# Patient Record
Sex: Female | Born: 1945 | Race: White | Hispanic: No | Marital: Married
Health system: Southern US, Community
[De-identification: ages and names within clinical notes are randomized; demographics above are authoritative.]

---

## 1999-01-02 ENCOUNTER — Other Ambulatory Visit: Admission: RE | Admit: 1999-01-02 | Discharge: 1999-01-02 | Payer: Self-pay | Admitting: Gynecology

## 2000-03-17 ENCOUNTER — Other Ambulatory Visit: Admission: RE | Admit: 2000-03-17 | Discharge: 2000-03-17 | Payer: Self-pay | Admitting: Gynecology

## 2000-09-22 ENCOUNTER — Other Ambulatory Visit: Admission: RE | Admit: 2000-09-22 | Discharge: 2000-09-22 | Payer: Self-pay | Admitting: Gynecology

## 2001-04-13 ENCOUNTER — Other Ambulatory Visit: Admission: RE | Admit: 2001-04-13 | Discharge: 2001-04-13 | Payer: Self-pay | Admitting: Gynecology

## 2002-05-25 ENCOUNTER — Other Ambulatory Visit: Admission: RE | Admit: 2002-05-25 | Discharge: 2002-05-25 | Payer: Self-pay | Admitting: Gynecology

## 2003-09-20 ENCOUNTER — Other Ambulatory Visit: Admission: RE | Admit: 2003-09-20 | Discharge: 2003-09-20 | Payer: Self-pay | Admitting: Gynecology

## 2004-11-07 ENCOUNTER — Other Ambulatory Visit: Admission: RE | Admit: 2004-11-07 | Discharge: 2004-11-07 | Payer: Self-pay | Admitting: Family Medicine

## 2005-01-14 ENCOUNTER — Encounter: Admission: RE | Admit: 2005-01-14 | Discharge: 2005-01-14 | Payer: Self-pay | Admitting: Family Medicine

## 2005-09-26 ENCOUNTER — Ambulatory Visit (HOSPITAL_COMMUNITY): Admission: RE | Admit: 2005-09-26 | Discharge: 2005-09-26 | Payer: Self-pay | Admitting: Orthopedic Surgery

## 2006-01-16 ENCOUNTER — Encounter: Admission: RE | Admit: 2006-01-16 | Discharge: 2006-01-16 | Payer: Self-pay | Admitting: Family Medicine

## 2006-01-28 ENCOUNTER — Other Ambulatory Visit: Admission: RE | Admit: 2006-01-28 | Discharge: 2006-01-28 | Payer: Self-pay | Admitting: Family Medicine

## 2007-01-19 ENCOUNTER — Encounter: Admission: RE | Admit: 2007-01-19 | Discharge: 2007-01-19 | Payer: Self-pay | Admitting: Family Medicine

## 2008-02-01 ENCOUNTER — Encounter: Admission: RE | Admit: 2008-02-01 | Discharge: 2008-02-01 | Payer: Self-pay | Admitting: Family Medicine

## 2008-04-22 ENCOUNTER — Other Ambulatory Visit: Admission: RE | Admit: 2008-04-22 | Discharge: 2008-04-22 | Payer: Self-pay | Admitting: Family Medicine

## 2008-05-10 ENCOUNTER — Encounter: Admission: RE | Admit: 2008-05-10 | Discharge: 2008-05-10 | Payer: Self-pay | Admitting: Orthopedic Surgery

## 2009-02-01 ENCOUNTER — Encounter: Admission: RE | Admit: 2009-02-01 | Discharge: 2009-02-01 | Payer: Self-pay | Admitting: Family Medicine

## 2009-05-02 ENCOUNTER — Other Ambulatory Visit: Admission: RE | Admit: 2009-05-02 | Discharge: 2009-05-02 | Payer: Self-pay | Admitting: Family Medicine

## 2009-06-02 ENCOUNTER — Emergency Department (HOSPITAL_COMMUNITY): Admission: EM | Admit: 2009-06-02 | Discharge: 2009-06-02 | Payer: Self-pay | Admitting: Emergency Medicine

## 2010-02-21 ENCOUNTER — Encounter: Admission: RE | Admit: 2010-02-21 | Discharge: 2010-02-21 | Payer: Self-pay | Admitting: Family Medicine

## 2010-10-10 LAB — URINALYSIS, ROUTINE W REFLEX MICROSCOPIC
Hgb urine dipstick: NEGATIVE
Urobilinogen, UA: 0.2 mg/dL (ref 0.0–1.0)
pH: 6.5 (ref 5.0–8.0)

## 2010-10-10 LAB — POCT I-STAT, CHEM 8
BUN: 12 mg/dL (ref 6–23)
Calcium, Ion: 1.14 mmol/L (ref 1.12–1.32)
Creatinine, Ser: 0.8 mg/dL (ref 0.4–1.2)
Glucose, Bld: 103 mg/dL — ABNORMAL HIGH (ref 70–99)
Hemoglobin: 15.6 g/dL — ABNORMAL HIGH (ref 12.0–15.0)
TCO2: 26 mmol/L (ref 0–100)

## 2010-10-10 LAB — URINE MICROSCOPIC-ADD ON

## 2010-10-10 LAB — POCT CARDIAC MARKERS
CKMB, poc: <1 ng/mL — ABNORMAL LOW (ref 1.0–8.0)
Myoglobin, poc: 28.2 ng/mL (ref 12–200)
Myoglobin, poc: 35.5 ng/mL (ref 12–200)

## 2011-06-05 ENCOUNTER — Other Ambulatory Visit: Payer: Self-pay | Admitting: Family Medicine

## 2011-06-05 DIAGNOSIS — Z1231 Encounter for screening mammogram for malignant neoplasm of breast: Secondary | ICD-10-CM

## 2011-07-04 ENCOUNTER — Ambulatory Visit
Admission: RE | Admit: 2011-07-04 | Discharge: 2011-07-04 | Disposition: A | Discharge status: Home / Self Care | Payer: Medicare Other | Source: Ambulatory Visit | Attending: Family Medicine | Admitting: Family Medicine

## 2011-07-04 DIAGNOSIS — Z1231 Encounter for screening mammogram for malignant neoplasm of breast: Secondary | ICD-10-CM

## 2012-06-24 ENCOUNTER — Other Ambulatory Visit: Payer: Self-pay | Admitting: Family Medicine

## 2012-06-24 DIAGNOSIS — Z1231 Encounter for screening mammogram for malignant neoplasm of breast: Secondary | ICD-10-CM

## 2012-07-15 ENCOUNTER — Ambulatory Visit
Admission: RE | Admit: 2012-07-15 | Discharge: 2012-07-15 | Disposition: A | Discharge status: Home / Self Care | Payer: Medicare Other | Source: Ambulatory Visit | Attending: Family Medicine | Admitting: Family Medicine

## 2012-07-15 DIAGNOSIS — Z1231 Encounter for screening mammogram for malignant neoplasm of breast: Secondary | ICD-10-CM

## 2013-07-20 ENCOUNTER — Other Ambulatory Visit: Payer: Self-pay

## 2013-07-20 DIAGNOSIS — Z1231 Encounter for screening mammogram for malignant neoplasm of breast: Secondary | ICD-10-CM

## 2013-08-09 ENCOUNTER — Ambulatory Visit
Admission: RE | Admit: 2013-08-09 | Discharge: 2013-08-09 | Disposition: A | Discharge status: Home / Self Care | Payer: Medicare Other | Source: Ambulatory Visit

## 2013-08-09 DIAGNOSIS — Z1231 Encounter for screening mammogram for malignant neoplasm of breast: Secondary | ICD-10-CM

## 2014-07-22 ENCOUNTER — Other Ambulatory Visit: Payer: Self-pay

## 2014-07-22 DIAGNOSIS — Z1231 Encounter for screening mammogram for malignant neoplasm of breast: Secondary | ICD-10-CM

## 2014-08-10 ENCOUNTER — Ambulatory Visit
Admission: RE | Admit: 2014-08-10 | Discharge: 2014-08-10 | Disposition: A | Discharge status: Home / Self Care | Payer: Medicare Other | Source: Ambulatory Visit

## 2014-08-10 DIAGNOSIS — Z1231 Encounter for screening mammogram for malignant neoplasm of breast: Secondary | ICD-10-CM

## 2015-08-17 ENCOUNTER — Other Ambulatory Visit: Payer: Self-pay

## 2015-08-17 DIAGNOSIS — Z1231 Encounter for screening mammogram for malignant neoplasm of breast: Secondary | ICD-10-CM

## 2015-09-13 ENCOUNTER — Ambulatory Visit
Admission: RE | Admit: 2015-09-13 | Discharge: 2015-09-13 | Disposition: A | Discharge status: Home / Self Care | Payer: Medicare Other | Source: Ambulatory Visit

## 2015-09-13 DIAGNOSIS — Z1231 Encounter for screening mammogram for malignant neoplasm of breast: Secondary | ICD-10-CM

## 2016-09-13 ENCOUNTER — Other Ambulatory Visit: Payer: Self-pay | Admitting: Family Medicine

## 2016-09-13 DIAGNOSIS — Z1231 Encounter for screening mammogram for malignant neoplasm of breast: Secondary | ICD-10-CM

## 2016-09-16 ENCOUNTER — Ambulatory Visit
Admission: RE | Admit: 2016-09-16 | Discharge: 2016-09-16 | Disposition: A | Discharge status: Home / Self Care | Payer: Medicare Other | Source: Ambulatory Visit | Attending: Family Medicine | Admitting: Family Medicine

## 2016-09-16 DIAGNOSIS — Z1231 Encounter for screening mammogram for malignant neoplasm of breast: Secondary | ICD-10-CM

## 2017-08-19 ENCOUNTER — Other Ambulatory Visit: Payer: Self-pay | Admitting: Family Medicine

## 2017-08-19 DIAGNOSIS — Z1231 Encounter for screening mammogram for malignant neoplasm of breast: Secondary | ICD-10-CM

## 2017-09-17 ENCOUNTER — Ambulatory Visit
Admission: RE | Admit: 2017-09-17 | Discharge: 2017-09-17 | Disposition: A | Discharge status: Home / Self Care | Payer: Medicare Other | Source: Ambulatory Visit | Attending: Family Medicine | Admitting: Family Medicine

## 2017-09-17 DIAGNOSIS — Z1231 Encounter for screening mammogram for malignant neoplasm of breast: Secondary | ICD-10-CM

## 2019-01-11 ENCOUNTER — Other Ambulatory Visit: Payer: Self-pay | Admitting: Family Medicine

## 2019-01-11 DIAGNOSIS — Z1231 Encounter for screening mammogram for malignant neoplasm of breast: Secondary | ICD-10-CM

## 2019-02-19 ENCOUNTER — Other Ambulatory Visit: Payer: Self-pay

## 2019-02-19 ENCOUNTER — Ambulatory Visit
Admission: RE | Admit: 2019-02-19 | Discharge: 2019-02-19 | Disposition: A | Discharge status: Home / Self Care | Payer: Medicare Other | Source: Ambulatory Visit | Attending: Family Medicine | Admitting: Family Medicine

## 2019-02-19 DIAGNOSIS — Z1231 Encounter for screening mammogram for malignant neoplasm of breast: Secondary | ICD-10-CM

## 2019-08-15 ENCOUNTER — Ambulatory Visit: Payer: Medicare Other

## 2019-09-02 ENCOUNTER — Ambulatory Visit: Payer: Medicare Other

## 2020-01-26 ENCOUNTER — Other Ambulatory Visit: Payer: Self-pay | Admitting: Family Medicine

## 2020-01-26 DIAGNOSIS — Z1231 Encounter for screening mammogram for malignant neoplasm of breast: Secondary | ICD-10-CM

## 2020-02-22 ENCOUNTER — Other Ambulatory Visit: Payer: Self-pay

## 2020-02-22 ENCOUNTER — Ambulatory Visit
Admission: RE | Admit: 2020-02-22 | Discharge: 2020-02-22 | Disposition: A | Discharge status: Home / Self Care | Payer: Medicare Other | Source: Ambulatory Visit | Attending: Family Medicine | Admitting: Family Medicine

## 2020-02-22 DIAGNOSIS — Z1231 Encounter for screening mammogram for malignant neoplasm of breast: Secondary | ICD-10-CM

## 2020-06-24 ENCOUNTER — Ambulatory Visit: Payer: Medicare Other | Attending: Internal Medicine

## 2020-06-24 DIAGNOSIS — Z23 Encounter for immunization: Secondary | ICD-10-CM

## 2020-06-24 NOTE — Progress Notes (Signed)
   Covid-19 Vaccination Clinic  Name:  Jodi Kramer    MRN: [id number] DOB: 11/05/1945  06/24/2020  Jodi Kramer was observed post Covid-19 immunization for 15 minutes without incident. She was provided with Vaccine Information Sheet and instruction to access the V-Safe system.   Jodi Kramer was instructed to call 911 with any severe reactions post vaccine: Marland Kitchen Difficulty breathing  . Swelling of face and throat  . A fast heartbeat  . A bad rash all over body  . Dizziness and weakness   Immunizations Administered    Name Date Dose VIS Date Route   Moderna Covid-19 Booster Vaccine 06/24/2020 10:22 AM 0.25 mL 04/26/2020 Intramuscular   Manufacturer: Gala Murdoch   Lot: [id number]   NDC: 50354-656-81

## 2020-07-11 DIAGNOSIS — H1033 Unspecified acute conjunctivitis, bilateral: Secondary | ICD-10-CM | POA: Diagnosis not present

## 2020-10-02 ENCOUNTER — Ambulatory Visit
Admission: RE | Admit: 2020-10-02 | Discharge: 2020-10-02 | Disposition: A | Discharge status: Home / Self Care | Payer: Medicare Other | Source: Ambulatory Visit | Attending: Family Medicine | Admitting: Family Medicine

## 2020-10-02 ENCOUNTER — Other Ambulatory Visit: Payer: Self-pay | Admitting: Family Medicine

## 2020-10-02 DIAGNOSIS — R059 Cough, unspecified: Secondary | ICD-10-CM | POA: Diagnosis not present

## 2020-10-02 DIAGNOSIS — J069 Acute upper respiratory infection, unspecified: Secondary | ICD-10-CM | POA: Diagnosis not present

## 2020-11-01 ENCOUNTER — Other Ambulatory Visit: Payer: Self-pay | Admitting: Family Medicine

## 2020-11-01 ENCOUNTER — Ambulatory Visit
Admission: RE | Admit: 2020-11-01 | Discharge: 2020-11-01 | Disposition: A | Discharge status: Home / Self Care | Payer: Medicare Other | Source: Ambulatory Visit | Attending: Family Medicine | Admitting: Family Medicine

## 2020-11-01 DIAGNOSIS — J189 Pneumonia, unspecified organism: Secondary | ICD-10-CM

## 2020-11-01 DIAGNOSIS — R7303 Prediabetes: Secondary | ICD-10-CM | POA: Diagnosis not present

## 2020-11-01 DIAGNOSIS — I129 Hypertensive chronic kidney disease with stage 1 through stage 4 chronic kidney disease, or unspecified chronic kidney disease: Secondary | ICD-10-CM | POA: Diagnosis not present

## 2020-11-01 DIAGNOSIS — N183 Chronic kidney disease, stage 3 unspecified: Secondary | ICD-10-CM | POA: Diagnosis not present

## 2020-11-01 DIAGNOSIS — J449 Chronic obstructive pulmonary disease, unspecified: Secondary | ICD-10-CM | POA: Diagnosis not present

## 2020-11-24 DIAGNOSIS — M461 Sacroiliitis, not elsewhere classified: Secondary | ICD-10-CM | POA: Diagnosis not present

## 2020-12-28 DIAGNOSIS — M545 Low back pain, unspecified: Secondary | ICD-10-CM | POA: Diagnosis not present

## 2021-01-03 ENCOUNTER — Ambulatory Visit
Admission: RE | Admit: 2021-01-03 | Discharge: 2021-01-03 | Disposition: A | Discharge status: Home / Self Care | Payer: Medicare Other | Source: Ambulatory Visit | Attending: Sports Medicine | Admitting: Sports Medicine

## 2021-01-03 ENCOUNTER — Other Ambulatory Visit: Payer: Self-pay

## 2021-01-03 ENCOUNTER — Other Ambulatory Visit: Payer: Self-pay | Admitting: Sports Medicine

## 2021-01-03 DIAGNOSIS — M545 Low back pain, unspecified: Secondary | ICD-10-CM

## 2021-01-24 DIAGNOSIS — M545 Low back pain, unspecified: Secondary | ICD-10-CM | POA: Diagnosis not present

## 2021-02-01 DIAGNOSIS — M545 Low back pain, unspecified: Secondary | ICD-10-CM | POA: Diagnosis not present

## 2021-02-02 ENCOUNTER — Other Ambulatory Visit: Payer: Self-pay | Admitting: Family Medicine

## 2021-02-02 DIAGNOSIS — Z03818 Encounter for observation for suspected exposure to other biological agents ruled out: Secondary | ICD-10-CM | POA: Diagnosis not present

## 2021-02-02 DIAGNOSIS — R509 Fever, unspecified: Secondary | ICD-10-CM | POA: Diagnosis not present

## 2021-02-02 DIAGNOSIS — J069 Acute upper respiratory infection, unspecified: Secondary | ICD-10-CM | POA: Diagnosis not present

## 2021-02-02 DIAGNOSIS — Z1231 Encounter for screening mammogram for malignant neoplasm of breast: Secondary | ICD-10-CM

## 2021-02-02 DIAGNOSIS — R059 Cough, unspecified: Secondary | ICD-10-CM | POA: Diagnosis not present

## 2021-02-05 DIAGNOSIS — J019 Acute sinusitis, unspecified: Secondary | ICD-10-CM | POA: Diagnosis not present

## 2021-02-05 DIAGNOSIS — J069 Acute upper respiratory infection, unspecified: Secondary | ICD-10-CM | POA: Diagnosis not present

## 2021-03-06 DIAGNOSIS — M7989 Other specified soft tissue disorders: Secondary | ICD-10-CM | POA: Diagnosis not present

## 2021-03-06 DIAGNOSIS — K521 Toxic gastroenteritis and colitis: Secondary | ICD-10-CM | POA: Diagnosis not present

## 2021-03-06 DIAGNOSIS — T3695XA Adverse effect of unspecified systemic antibiotic, initial encounter: Secondary | ICD-10-CM | POA: Diagnosis not present

## 2021-03-08 DIAGNOSIS — Z96652 Presence of left artificial knee joint: Secondary | ICD-10-CM | POA: Diagnosis not present

## 2021-03-23 ENCOUNTER — Ambulatory Visit
Admission: RE | Admit: 2021-03-23 | Discharge: 2021-03-23 | Disposition: A | Discharge status: Home / Self Care | Payer: Medicare Other | Source: Ambulatory Visit | Attending: Family Medicine | Admitting: Family Medicine

## 2021-03-23 ENCOUNTER — Other Ambulatory Visit: Payer: Self-pay

## 2021-03-23 DIAGNOSIS — Z1231 Encounter for screening mammogram for malignant neoplasm of breast: Secondary | ICD-10-CM | POA: Diagnosis not present

## 2021-05-20 IMAGING — CR DG CHEST 2V
1 series · 1 of 1 positions shown · non-contrast
Comparison: Chest x-ray 10/02/2020

CLINICAL DATA: Right-sided pneumonia.

EXAM:
CHEST - 2 VIEW

[w chest lat]
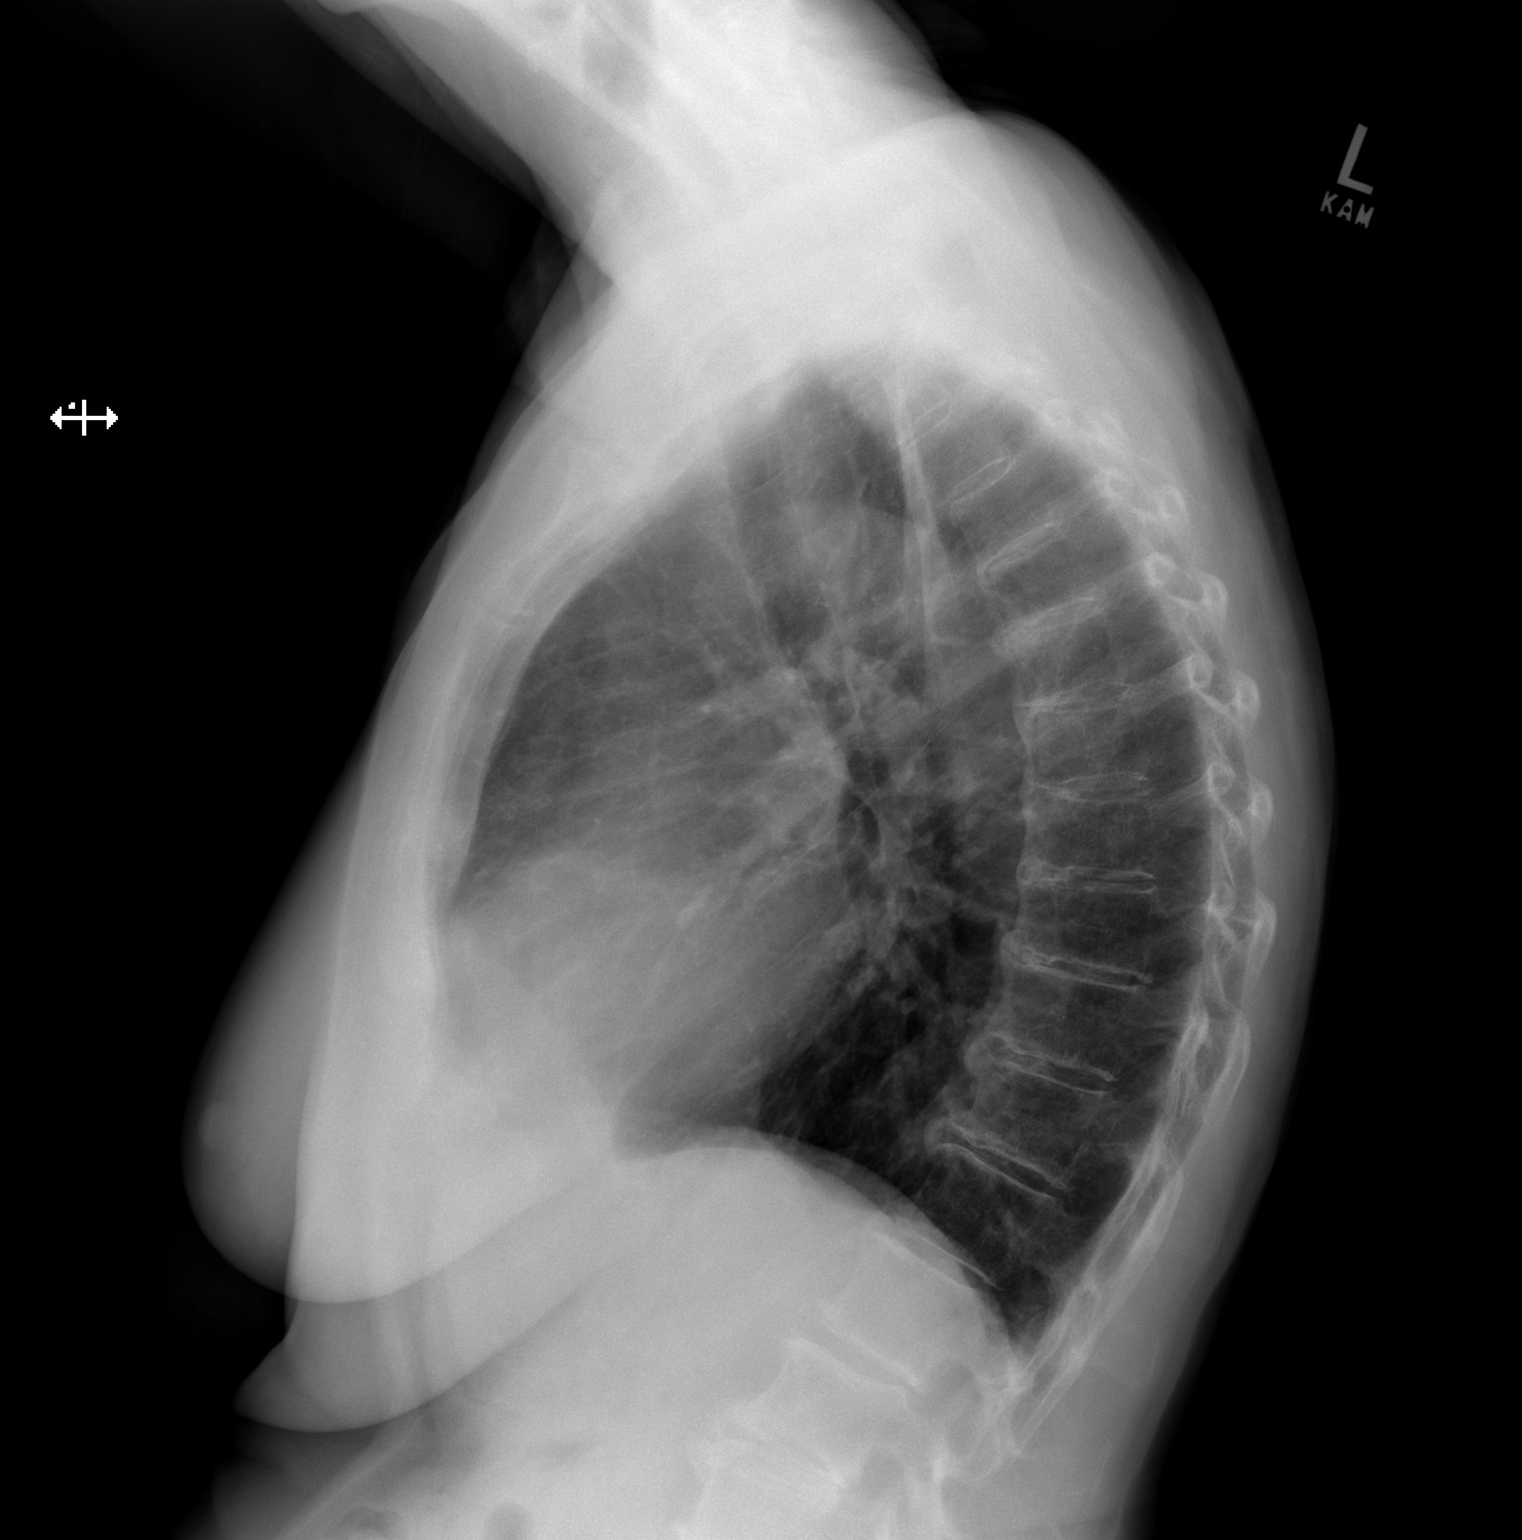

[1 of 1 positions shown; findings below may reference images not displayed]

FINDINGS: Heart size is normal. Previously noted right middle lobe airspace
disease has resolved. Changes of COPD again noted. No edema or
effusion is present. Exaggerated thoracic kyphosis is stable.
IMPRESSION: Interval resolution of right middle lobe airspace disease.

Stable changes of COPD.

## 2021-10-09 IMAGING — MG MM DIGITAL SCREENING BILAT W/ TOMO AND CAD
8 series · 8 of 24 positions shown · non-contrast
Comparison: Previous exam(s).

CLINICAL DATA: Screening.

EXAM:
DIGITAL SCREENING BILATERAL MAMMOGRAM WITH TOMOSYNTHESIS AND CAD
TECHNIQUE: Bilateral screening digital craniocaudal and mediolateral oblique
mammograms were obtained. Bilateral screening digital breast
tomosynthesis was performed. The images were evaluated with
computer-aided detection.

[R CC synth-2D]
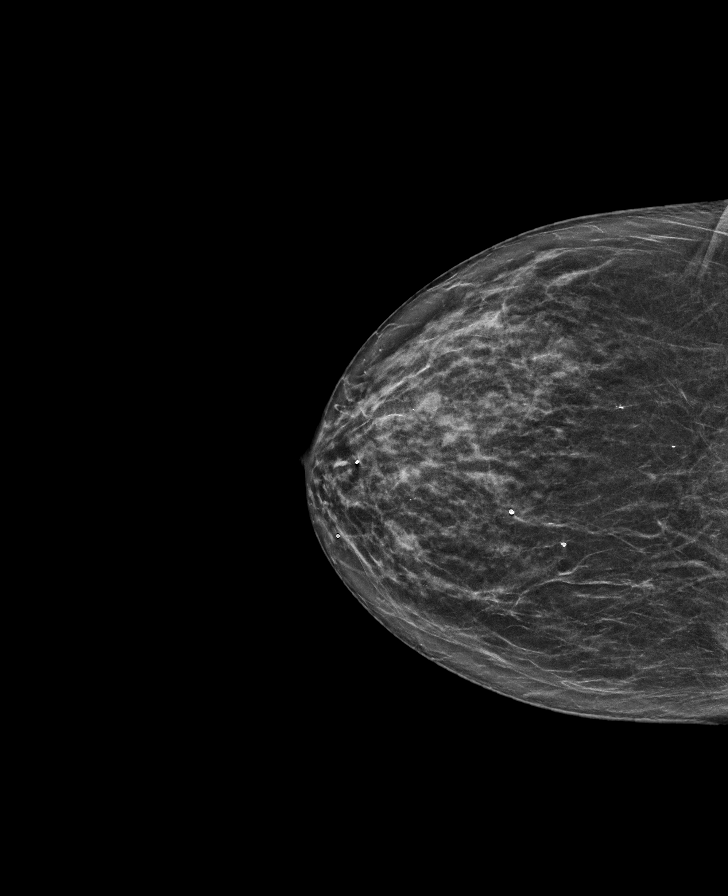

[L MLO synth-2D]
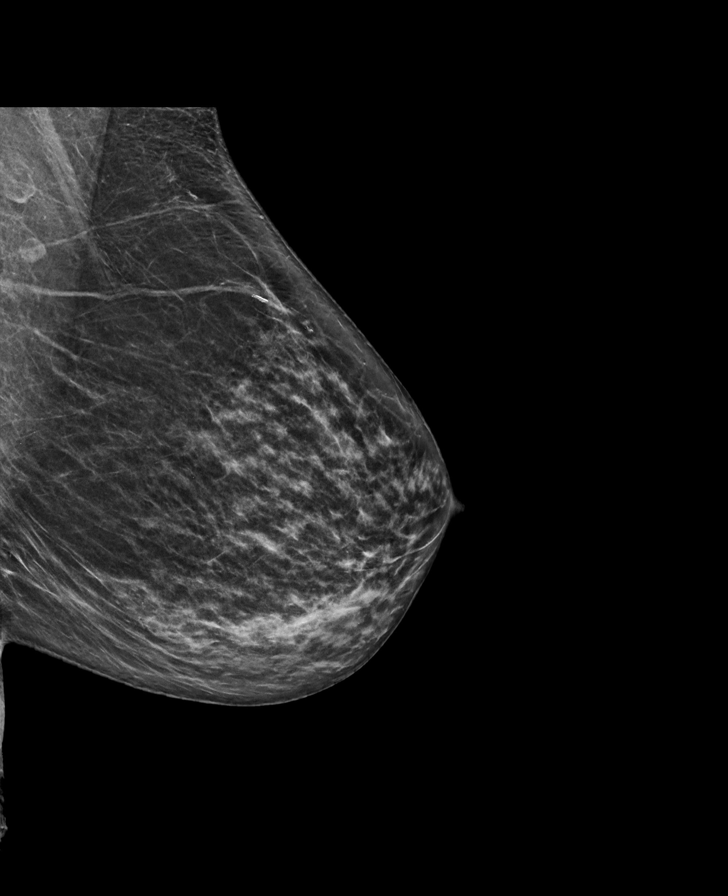

[L CC synth-2D]
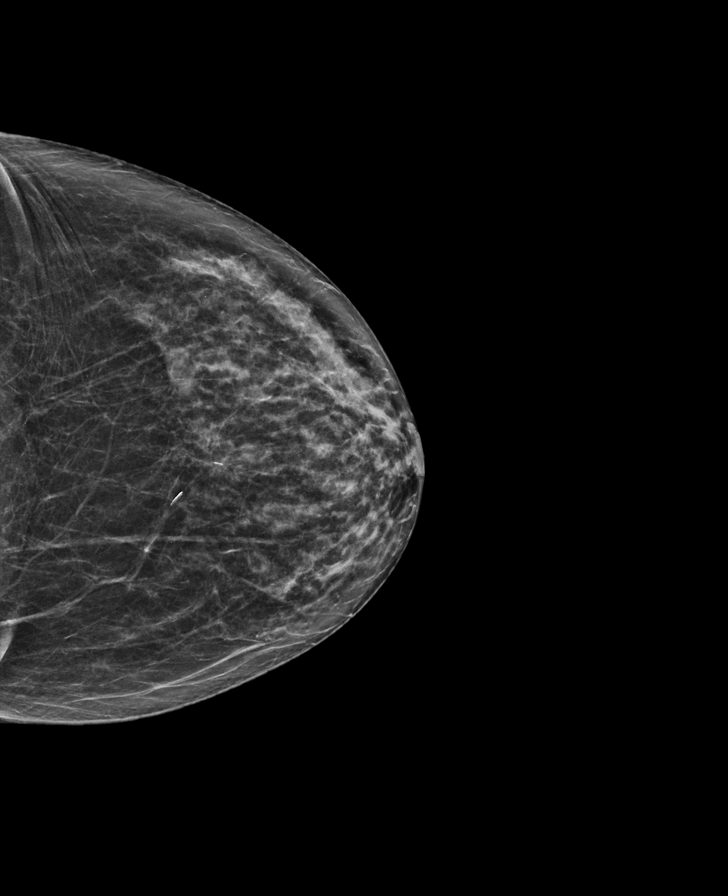

[R MLO synth-2D]
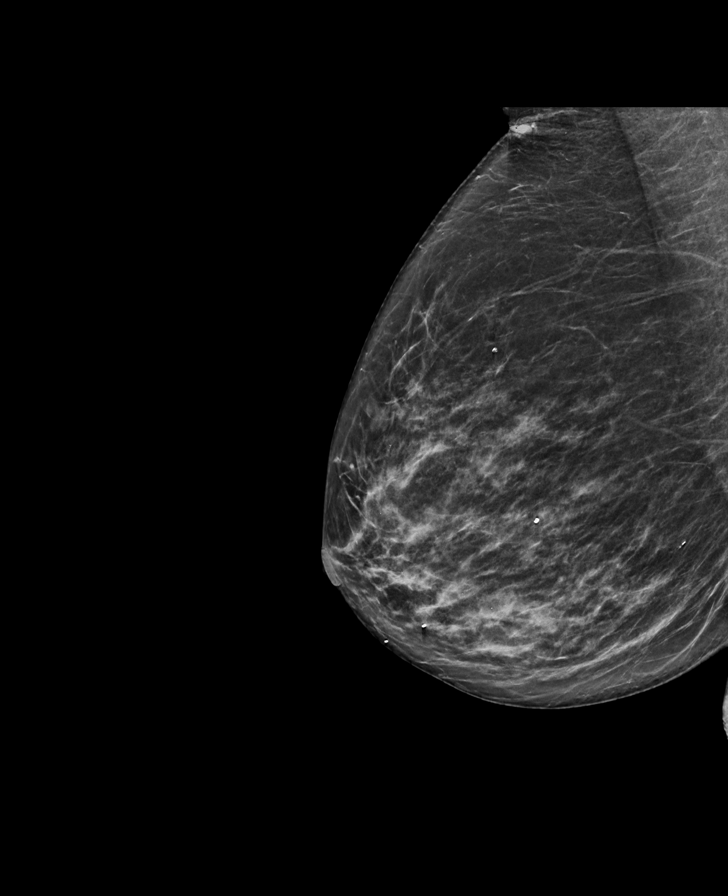

[L CC tomo · tomo slice 32/63.0]
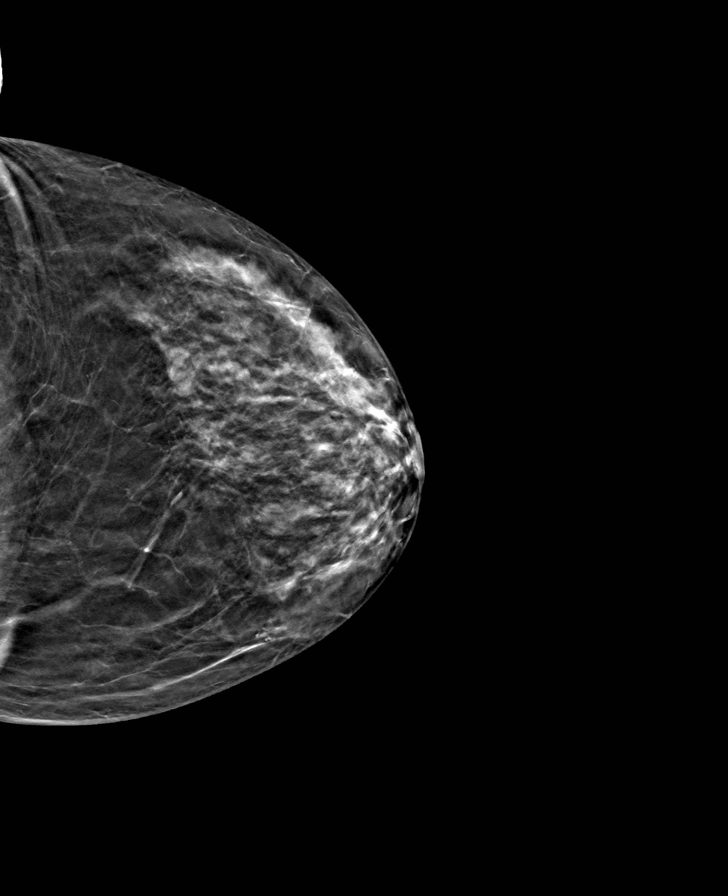

[R CC tomo · tomo slice 31/62.0]
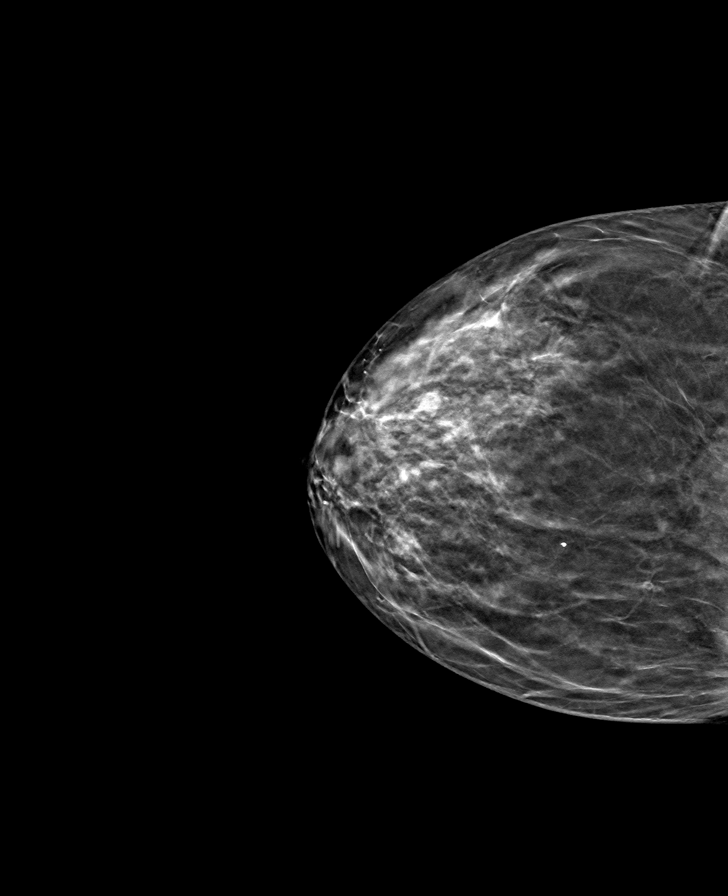

[L MLO tomo · tomo slice 33/64.0]
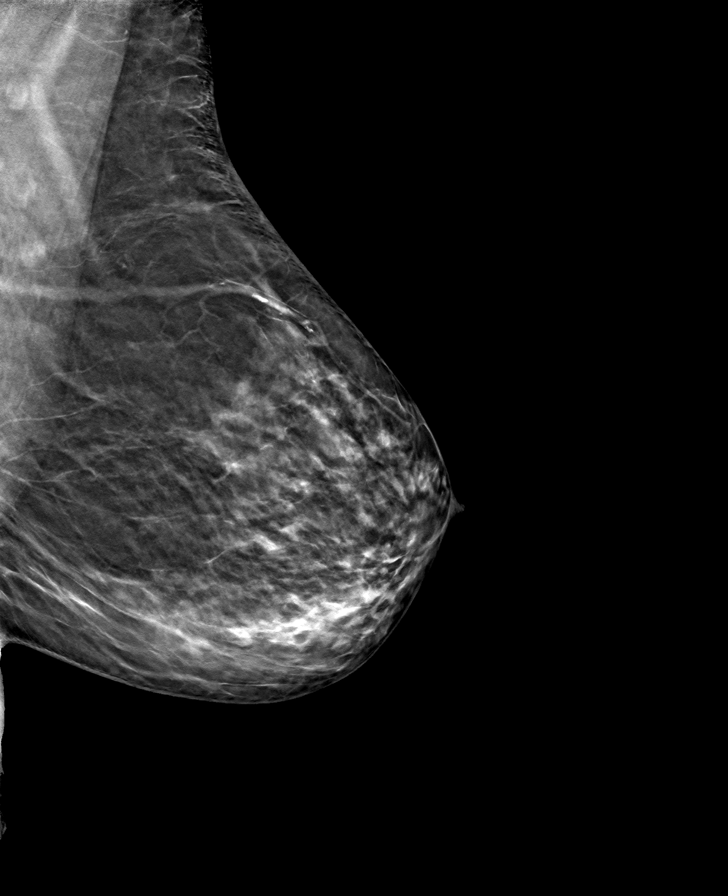

[R MLO tomo · tomo slice 33/66.0]
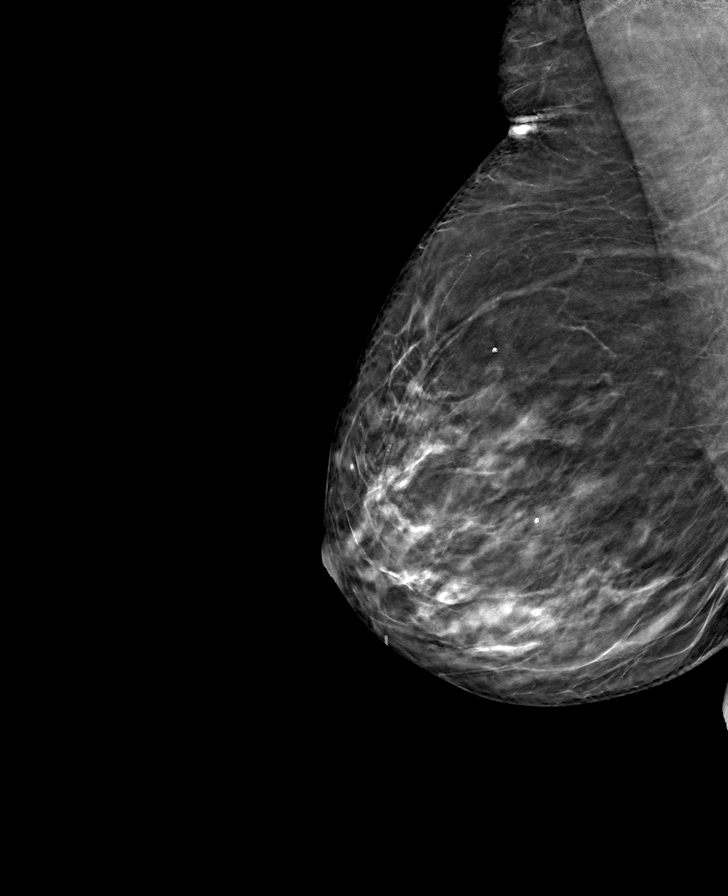

[8 of 24 positions shown; findings below may reference images not displayed]

ACR Breast Density Category b: There are scattered areas of
fibroglandular density.
FINDINGS: There are no findings suspicious for malignancy.
IMPRESSION: No mammographic evidence of malignancy. A result letter of this
screening mammogram will be mailed directly to the patient.

RECOMMENDATION:
Screening mammogram in one year. (Code:51-O-LD2)

BI-RADS CATEGORY  1: Negative.

## 2022-02-12 ENCOUNTER — Other Ambulatory Visit: Payer: Self-pay | Admitting: Family Medicine

## 2022-02-12 ENCOUNTER — Other Ambulatory Visit: Payer: Self-pay | Admitting: Internal Medicine

## 2022-02-12 DIAGNOSIS — Z1231 Encounter for screening mammogram for malignant neoplasm of breast: Secondary | ICD-10-CM

## 2022-03-25 ENCOUNTER — Ambulatory Visit: Payer: Medicare Other

## 2022-04-19 ENCOUNTER — Ambulatory Visit
Admission: RE | Admit: 2022-04-19 | Discharge: 2022-04-19 | Disposition: A | Discharge status: Home / Self Care | Payer: Medicare Other | Source: Ambulatory Visit | Attending: Internal Medicine | Admitting: Internal Medicine

## 2022-04-19 DIAGNOSIS — Z1231 Encounter for screening mammogram for malignant neoplasm of breast: Secondary | ICD-10-CM

## 2022-09-04 ENCOUNTER — Other Ambulatory Visit: Payer: Self-pay | Admitting: Nurse Practitioner

## 2022-09-04 ENCOUNTER — Ambulatory Visit
Admission: RE | Admit: 2022-09-04 | Discharge: 2022-09-04 | Disposition: A | Discharge status: Home / Self Care | Payer: Worker's Compensation | Source: Ambulatory Visit | Attending: Nurse Practitioner | Admitting: Nurse Practitioner

## 2022-09-04 DIAGNOSIS — M7989 Other specified soft tissue disorders: Secondary | ICD-10-CM

## 2023-03-20 ENCOUNTER — Other Ambulatory Visit: Payer: Self-pay | Admitting: Internal Medicine

## 2023-03-20 DIAGNOSIS — Z1231 Encounter for screening mammogram for malignant neoplasm of breast: Secondary | ICD-10-CM

## 2023-04-29 ENCOUNTER — Ambulatory Visit
Admission: RE | Admit: 2023-04-29 | Discharge: 2023-04-29 | Disposition: A | Discharge status: Home / Self Care | Payer: Medicare Other | Source: Ambulatory Visit | Attending: Internal Medicine | Admitting: Internal Medicine

## 2023-04-29 DIAGNOSIS — Z1231 Encounter for screening mammogram for malignant neoplasm of breast: Secondary | ICD-10-CM

## 2023-05-05 ENCOUNTER — Encounter (HOSPITAL_COMMUNITY): Payer: Self-pay

## 2023-05-05 ENCOUNTER — Other Ambulatory Visit: Payer: Self-pay

## 2023-05-05 ENCOUNTER — Other Ambulatory Visit (HOSPITAL_COMMUNITY): Payer: Self-pay

## 2023-05-05 MED ORDER — TYMLOS 3120 MCG/1.56ML ~~LOC~~ SOPN
PEN_INJECTOR | SUBCUTANEOUS | 11 refills | Status: AC
  Filled 2023-05-05: qty 1.56, 28d supply, fill #0
  Filled 2023-05-05 (×3): qty 1.56, 30d supply, fill #0
  Filled 2023-05-22: qty 1.56, 30d supply, fill #1
  Filled 2023-06-16 – 2023-06-24 (×2): qty 1.56, 30d supply, fill #2
  Filled 2023-07-11: qty 1.56, 30d supply, fill #3

## 2023-05-05 NOTE — Progress Notes (Signed)
Specialty Pharmacy Initiation Note   Jodi Kramer is a 77 y.o. female who will be followed by the specialty pharmacy service for RxSp Osteoporosis. Patient reports she has been on Tymlos for 1 month. Patient's husband administers the injections.    Review of administration, indication, effectiveness, safety, potential side effects, and storage/disposable occurred today for patient's specialty medication(s) Abaloparatide     Patient/Caregiver did not have any additional questions or concerns.   Patient's therapy is appropriate to: Initiate    Goals Addressed             This Visit's Progress    Stabilization of disease       Patient is initiating therapy. Patient will maintain adherence         Bobette Mo Specialty Pharmacist

## 2023-05-05 NOTE — Progress Notes (Signed)
Specialty Pharmacy Initial Fill Coordination Note  Jodi Kramer is a 77 y.o. female contacted today regarding refills of specialty medication(s) Abaloparatide   Patient requested [name] at [facility] at [place] date: 05/07/23   Medication will be filled on 10/29.   Patient is aware of $884.76 copayment.

## 2023-05-06 ENCOUNTER — Other Ambulatory Visit: Payer: Self-pay

## 2023-05-06 ENCOUNTER — Other Ambulatory Visit (HOSPITAL_COMMUNITY): Payer: Self-pay

## 2023-05-22 ENCOUNTER — Other Ambulatory Visit (HOSPITAL_COMMUNITY): Payer: Self-pay

## 2023-05-22 NOTE — Progress Notes (Signed)
Specialty Pharmacy Refill Coordination Note  Jodi Kramer is a 77 y.o. female contacted today regarding refills of specialty medication(s) Abaloparatide   Patient requested [name] at [facility] at [place] date: 05/30/23   Medication will be filled on 05/30/23.

## 2023-05-26 ENCOUNTER — Other Ambulatory Visit: Payer: Self-pay

## 2023-05-30 ENCOUNTER — Other Ambulatory Visit (HOSPITAL_COMMUNITY): Payer: Self-pay

## 2023-05-30 ENCOUNTER — Other Ambulatory Visit: Payer: Self-pay

## 2023-06-10 ENCOUNTER — Other Ambulatory Visit (HOSPITAL_COMMUNITY): Payer: Self-pay

## 2023-06-16 ENCOUNTER — Other Ambulatory Visit: Payer: Self-pay

## 2023-06-16 NOTE — Progress Notes (Signed)
Specialty Pharmacy Refill Coordination Note  Jodi Kramer is a 77 y.o. female contacted today regarding refills of specialty medication(s) Abaloparatide   Patient requested [name] at [facility] at [place] date: 06/25/23   Medication will be filled on 06/24/23.

## 2023-06-24 ENCOUNTER — Other Ambulatory Visit (HOSPITAL_COMMUNITY): Payer: Self-pay

## 2023-06-24 ENCOUNTER — Other Ambulatory Visit: Payer: Self-pay

## 2023-07-11 ENCOUNTER — Other Ambulatory Visit: Payer: Self-pay

## 2023-07-11 NOTE — Progress Notes (Signed)
    Specialty Pharmacy Refill Coordination Note  Jodi Kramer is a 78 y.o. female contacted today regarding refills of specialty medication(s) Abaloparatide  (Tymlos )   Patient requested [name] at [facility] at [place] date: 07/21/23   Medication will be filled on 07/21/23.

## 2023-07-21 ENCOUNTER — Other Ambulatory Visit: Payer: Self-pay

## 2023-07-21 NOTE — Progress Notes (Signed)
 Patient wants [place] to cancel prescription for now. She is having side effects and wants to talk to her provider. She has an appointment this week and would like [place] to call her a few days after.

## 2023-07-23 ENCOUNTER — Other Ambulatory Visit: Payer: Self-pay

## 2023-08-08 ENCOUNTER — Other Ambulatory Visit: Payer: Self-pay | Admitting: Family Medicine

## 2023-08-08 ENCOUNTER — Ambulatory Visit
Admission: RE | Admit: 2023-08-08 | Discharge: 2023-08-08 | Disposition: A | Discharge status: Home / Self Care | Payer: Medicare Other | Source: Ambulatory Visit | Attending: Family Medicine | Admitting: Family Medicine

## 2023-08-08 DIAGNOSIS — R634 Abnormal weight loss: Secondary | ICD-10-CM

## 2023-08-08 MED ORDER — IOPAMIDOL (ISOVUE-300) INJECTION 61%
100.0000 mL | Freq: Once | INTRAVENOUS | Status: AC | PRN
  Administered 2023-08-08: 100 mL via INTRAVENOUS

## 2023-08-19 ENCOUNTER — Other Ambulatory Visit: Payer: Self-pay

## 2023-08-19 ENCOUNTER — Other Ambulatory Visit (HOSPITAL_COMMUNITY): Payer: Self-pay

## 2023-08-22 ENCOUNTER — Other Ambulatory Visit (HOSPITAL_COMMUNITY): Payer: Self-pay

## 2023-10-31 ENCOUNTER — Other Ambulatory Visit: Payer: Self-pay

## 2023-10-31 NOTE — Progress Notes (Signed)
 Patient has not restarted Tymlos  and has not called us  back to advise of her plans to permanently discontinue or restart. Last filled 06/24/23. Disenrolling.

## 2023-11-06 DEATH — deceased

## 2024-06-29 ENCOUNTER — Other Ambulatory Visit: Payer: Self-pay
